# Patient Record
Sex: Male | Born: 1970 | Race: White | Hispanic: No | Marital: Married | Smoking: Never smoker
Health system: Southern US, Community
[De-identification: ages and names within clinical notes are randomized; demographics above are authoritative.]

## PROBLEM LIST (undated history)

## (undated) DIAGNOSIS — F419 Anxiety disorder, unspecified: Secondary | ICD-10-CM

## (undated) DIAGNOSIS — G4730 Sleep apnea, unspecified: Secondary | ICD-10-CM

---

## 2006-03-25 HISTORY — PX: VASECTOMY: SHX75

## 2011-10-31 ENCOUNTER — Ambulatory Visit (INDEPENDENT_AMBULATORY_CARE_PROVIDER_SITE_OTHER): Payer: BC Managed Care – PPO | Admitting: General Surgery

## 2011-11-20 ENCOUNTER — Ambulatory Visit (INDEPENDENT_AMBULATORY_CARE_PROVIDER_SITE_OTHER): Payer: BC Managed Care – PPO | Admitting: General Surgery

## 2011-11-20 ENCOUNTER — Telehealth (INDEPENDENT_AMBULATORY_CARE_PROVIDER_SITE_OTHER): Payer: Self-pay

## 2011-11-20 ENCOUNTER — Encounter (INDEPENDENT_AMBULATORY_CARE_PROVIDER_SITE_OTHER): Payer: Self-pay | Admitting: General Surgery

## 2011-11-20 VITALS — BP 138/94 | HR 77 | Temp 98.2°F | Ht 70.0 in | Wt 162.4 lb

## 2011-11-20 DIAGNOSIS — D1779 Benign lipomatous neoplasm of other sites: Secondary | ICD-10-CM

## 2011-11-20 DIAGNOSIS — D1720 Benign lipomatous neoplasm of skin and subcutaneous tissue of unspecified limb: Secondary | ICD-10-CM

## 2011-11-20 NOTE — Telephone Encounter (Signed)
[facility] (referring office [phone]) called requesting notes to be faxed.

## 2011-11-20 NOTE — Progress Notes (Signed)
Patient ID: Hunter Garza. 39, male   DOB: 1970-07-14, 41 y.o.   MRN: [id number]  Chief Complaint  Patient presents with  . Pre-op Exam    eval fatty nodule inner Rt thigh    HPI Hunter Garza is a 41 y.o. male.  This patient was referred by Dr. Clydene Pugh for evaluation of a right lower extremity mass. He says that he first noticed this mass on his leg about 1 1/2 months ago where it was more prominent than now. He says it was raised up and was visibly noticeable but was not causing a discomfort. He said over this last month it has decreased in size and still remains asymptomatic. He has not noticed any redness or drainage and has no other masses.  HPI  History reviewed. No pertinent past medical history.  Past Surgical History  Procedure Date  . Vasectomy 2008    History reviewed. No pertinent family history.  Social History History  Substance Use Topics  . Smoking status: Never Smoker   . Smokeless tobacco: Not on file  . Alcohol Use: Yes     social    No Known Allergies  Current Outpatient Prescriptions  Medication Sig Dispense Refill  . PARoxetine (PAXIL) 20 MG tablet         Review of Systems Review of Systems All other review of systems negative or noncontributory except as stated in the HPI  Blood pressure 138/94, pulse 77, temperature 98.2 F (36.8 C), temperature source Temporal, height 5\' 10"  (1.778 m), weight 162 lb 6.4 oz (73.664 kg), SpO2 98.00%.  Physical Exam Physical Exam Physical Exam  Vitals reviewed. Constitutional: He is oriented to person, place, and time. He appears well-developed and well-nourished. No distress.  HENT:  Head: Normocephalic and atraumatic.  Mouth/Throat: No oropharyngeal exudate.  Eyes: Conjunctivae and EOM are normal. Pupils are equal, round, and reactive to light. Right eye exhibits no discharge. Left eye exhibits no discharge. No scleral icterus.  Neck: Normal range of motion. No tracheal deviation present.    Cardiovascular: Normal rate, regular rhythm and normal heart sounds.   Pulmonary/Chest: Effort normal and breath sounds normal. No stridor. No respiratory distress. He has no wheezes. He has no rales. He exhibits no tenderness.  Abdominal: Soft. Bowel sounds are normal. He exhibits no distension and no mass. There is no tenderness. There is no rebound and no guarding.  Musculoskeletal: Normal range of motion. He exhibits no edema and no tenderness.  Neurological: He is alert and oriented to person, place, and time.  Skin: Skin is warm and dry. No rash noted. He is not diaphoretic. No erythema. No pallor. 2cm well circumscribed nodule on right upper thigh, no sign of infection, no tenderness Psychiatric: He has a normal mood and affect. His behavior is normal. Judgment and thought content normal.    Data Reviewed Outside records  Assessment    Right thigh lipoma I think that this mass is most likely a small lipoma. It is small and actually decreased in size over the last month. I discussed with him the options for imaging with MRI versus surgical excision and we discussed the pros and cons of each and he would like to continue with observation for now. This is asymptomatic and I think is a low likelihood for malignancy especially given its decrease in size over the last month. I offered excision for treatment and biopsy but again he would like to hold on this for now. If he decides that he would  like to proceed with excision would be happy to schedule this for him as an outpatient procedure. He will call us back if he changes his mind and would like to have this removed   Plan    Continued observation and plan for excision if any change in appearance, increase in size or symptoms.       Lodema Pilot DAVID 11/20/2011, 9:12 AM

## 2014-11-13 ENCOUNTER — Observation Stay (HOSPITAL_COMMUNITY)
Admission: EM | Admit: 2014-11-13 | Discharge: 2014-11-14 | Disposition: A | Discharge status: Home / Self Care | Payer: 59 | Attending: Orthopedic Surgery | Admitting: Orthopedic Surgery

## 2014-11-13 ENCOUNTER — Emergency Department (HOSPITAL_COMMUNITY): Payer: 59

## 2014-11-13 ENCOUNTER — Emergency Department (HOSPITAL_COMMUNITY): Payer: 59 | Admitting: Anesthesiology

## 2014-11-13 ENCOUNTER — Encounter (HOSPITAL_COMMUNITY)
Admission: EM | Disposition: A | Discharge status: Home / Self Care | Payer: Self-pay | Source: Home / Self Care | Attending: Emergency Medicine

## 2014-11-13 ENCOUNTER — Encounter (HOSPITAL_COMMUNITY): Payer: Self-pay

## 2014-11-13 DIAGNOSIS — R52 Pain, unspecified: Secondary | ICD-10-CM | POA: Diagnosis present

## 2014-11-13 DIAGNOSIS — S42032B Displaced fracture of lateral end of left clavicle, initial encounter for open fracture: Secondary | ICD-10-CM | POA: Diagnosis not present

## 2014-11-13 DIAGNOSIS — S80212A Abrasion, left knee, initial encounter: Secondary | ICD-10-CM | POA: Diagnosis not present

## 2014-11-13 DIAGNOSIS — Z8781 Personal history of (healed) traumatic fracture: Secondary | ICD-10-CM

## 2014-11-13 DIAGNOSIS — S42142A Displaced fracture of glenoid cavity of scapula, left shoulder, initial encounter for closed fracture: Secondary | ICD-10-CM | POA: Insufficient documentation

## 2014-11-13 DIAGNOSIS — S42002B Fracture of unspecified part of left clavicle, initial encounter for open fracture: Secondary | ICD-10-CM

## 2014-11-13 DIAGNOSIS — Z79899 Other long term (current) drug therapy: Secondary | ICD-10-CM | POA: Insufficient documentation

## 2014-11-13 DIAGNOSIS — Y9355 Activity, bike riding: Secondary | ICD-10-CM | POA: Insufficient documentation

## 2014-11-13 DIAGNOSIS — Z9889 Other specified postprocedural states: Secondary | ICD-10-CM

## 2014-11-13 DIAGNOSIS — F419 Anxiety disorder, unspecified: Secondary | ICD-10-CM | POA: Insufficient documentation

## 2014-11-13 DIAGNOSIS — S42153A Displaced fracture of neck of scapula, unspecified shoulder, initial encounter for closed fracture: Secondary | ICD-10-CM

## 2014-11-13 DIAGNOSIS — S42143A Displaced fracture of glenoid cavity of scapula, unspecified shoulder, initial encounter for closed fracture: Secondary | ICD-10-CM | POA: Diagnosis present

## 2014-11-13 DIAGNOSIS — G4730 Sleep apnea, unspecified: Secondary | ICD-10-CM | POA: Insufficient documentation

## 2014-11-13 DIAGNOSIS — S42009A Fracture of unspecified part of unspecified clavicle, initial encounter for closed fracture: Secondary | ICD-10-CM | POA: Diagnosis present

## 2014-11-13 DIAGNOSIS — S42022B Displaced fracture of shaft of left clavicle, initial encounter for open fracture: Secondary | ICD-10-CM

## 2014-11-13 DIAGNOSIS — S42023B Displaced fracture of shaft of unspecified clavicle, initial encounter for open fracture: Secondary | ICD-10-CM

## 2014-11-13 HISTORY — PX: ORIF CLAVICULAR FRACTURE: SHX5055

## 2014-11-13 HISTORY — DX: Sleep apnea, unspecified: G47.30

## 2014-11-13 HISTORY — DX: Anxiety disorder, unspecified: F41.9

## 2014-11-13 SURGERY — OPEN REDUCTION INTERNAL FIXATION (ORIF) CLAVICULAR FRACTURE
Anesthesia: General | Site: Shoulder | Laterality: Left

## 2014-11-13 MED ORDER — FENTANYL CITRATE (PF) 250 MCG/5ML IJ SOLN
INTRAMUSCULAR | Status: AC
  Filled 2014-11-13: qty 5

## 2014-11-13 MED ORDER — PROPOFOL 10 MG/ML IV BOLUS
INTRAVENOUS | Status: DC | PRN
  Administered 2014-11-13: 200 mg via INTRAVENOUS

## 2014-11-13 MED ORDER — OXYCODONE-ACETAMINOPHEN 5-325 MG PO TABS: 1.0000 | ORAL_TABLET | Freq: Four times a day (QID) | ORAL | Status: AC | PRN

## 2014-11-13 MED ORDER — DEXTROSE 5 % IV SOLN
1.5000 g | Freq: Once | INTRAVENOUS | Status: AC
  Administered 2014-11-13: 1.5 g via INTRAVENOUS
  Filled 2014-11-13: qty 1.5

## 2014-11-13 MED ORDER — OXYCODONE-ACETAMINOPHEN 5-325 MG PO TABS
1.0000 | ORAL_TABLET | Freq: Once | ORAL | Status: AC
  Administered 2014-11-13: 1 via ORAL

## 2014-11-13 MED ORDER — LIDOCAINE HCL (CARDIAC) 20 MG/ML IV SOLN
INTRAVENOUS | Status: AC
  Filled 2014-11-13: qty 5

## 2014-11-13 MED ORDER — ONDANSETRON HCL 4 MG/2ML IJ SOLN
INTRAMUSCULAR | Status: AC
  Filled 2014-11-13: qty 2

## 2014-11-13 MED ORDER — SODIUM CHLORIDE 0.9 % IR SOLN
Status: DC | PRN
  Administered 2014-11-13: 6000 mL

## 2014-11-13 MED ORDER — LIDOCAINE HCL (CARDIAC) 20 MG/ML IV SOLN
INTRAVENOUS | Status: DC | PRN
  Administered 2014-11-13: 70 mg via INTRAVENOUS

## 2014-11-13 MED ORDER — OXYCODONE-ACETAMINOPHEN 5-325 MG PO TABS
ORAL_TABLET | ORAL | Status: AC
  Filled 2014-11-13: qty 1

## 2014-11-13 MED ORDER — FENTANYL CITRATE (PF) 100 MCG/2ML IJ SOLN
INTRAMUSCULAR | Status: DC | PRN
Start: 2014-11-13 — End: 2014-11-14
  Administered 2014-11-13 (×5): 50 ug via INTRAVENOUS

## 2014-11-13 MED ORDER — HYDROCODONE-ACETAMINOPHEN 5-325 MG PO TABS: 2.0000 | ORAL_TABLET | Freq: Once | ORAL | Status: DC

## 2014-11-13 MED ORDER — ONDANSETRON HCL 4 MG/2ML IJ SOLN
INTRAMUSCULAR | Status: DC | PRN
  Administered 2014-11-13: 4 mg via INTRAVENOUS

## 2014-11-13 MED ORDER — ONDANSETRON HCL 4 MG PO TABS: 4.0000 mg | ORAL_TABLET | Freq: Three times a day (TID) | ORAL | Status: AC | PRN

## 2014-11-13 MED ORDER — SUCCINYLCHOLINE CHLORIDE 20 MG/ML IJ SOLN
INTRAMUSCULAR | Status: DC | PRN
  Administered 2014-11-13: 120 mg via INTRAVENOUS

## 2014-11-13 MED ORDER — PROPOFOL 10 MG/ML IV BOLUS
INTRAVENOUS | Status: AC
  Filled 2014-11-13: qty 20

## 2014-11-13 MED ORDER — OXYCODONE-ACETAMINOPHEN 5-325 MG PO TABS: 1.0000 | ORAL_TABLET | Freq: Once | ORAL | Status: DC

## 2014-11-13 MED ORDER — SUCCINYLCHOLINE CHLORIDE 20 MG/ML IJ SOLN
INTRAMUSCULAR | Status: AC
  Filled 2014-11-13: qty 1

## 2014-11-13 MED ORDER — SODIUM CHLORIDE 0.9 % IV BOLUS (SEPSIS)
1000.0000 mL | Freq: Once | INTRAVENOUS | Status: AC
  Administered 2014-11-13: 1000 mL via INTRAVENOUS

## 2014-11-13 MED ORDER — MIDAZOLAM HCL 5 MG/5ML IJ SOLN
INTRAMUSCULAR | Status: DC | PRN
  Administered 2014-11-13: 2 mg via INTRAVENOUS

## 2014-11-13 MED ORDER — TETANUS-DIPHTH-ACELL PERTUSSIS 5-2.5-18.5 LF-MCG/0.5 IM SUSP
0.5000 mL | Freq: Once | INTRAMUSCULAR | Status: AC
  Administered 2014-11-13: 0.5 mL via INTRAMUSCULAR
  Filled 2014-11-13: qty 0.5

## 2014-11-13 MED ORDER — LACTATED RINGERS IV SOLN
INTRAVENOUS | Status: DC | PRN
  Administered 2014-11-13 (×2): via INTRAVENOUS

## 2014-11-13 MED ORDER — DEXTROSE 5 % IV SOLN: 1.5000 g | INTRAVENOUS | Status: DC | PRN

## 2014-11-13 MED ORDER — SENNA-DOCUSATE SODIUM 8.6-50 MG PO TABS: 2.0000 | ORAL_TABLET | Freq: Every day | ORAL | Status: AC

## 2014-11-13 MED ORDER — DEXTROSE 5 % IV SOLN
INTRAVENOUS | Status: AC
  Filled 2014-11-13: qty 1.5

## 2014-11-13 MED ORDER — DEXTROSE 5 % IV SOLN
1.5000 g | INTRAVENOUS | Status: DC | PRN
  Administered 2014-11-13: 1.5 g via INTRAVENOUS

## 2014-11-13 MED ORDER — MIDAZOLAM HCL 2 MG/2ML IJ SOLN
INTRAMUSCULAR | Status: AC
  Filled 2014-11-13: qty 4

## 2014-11-13 MED ORDER — 0.9 % SODIUM CHLORIDE (POUR BTL) OPTIME
TOPICAL | Status: DC | PRN
  Administered 2014-11-13: 1000 mL

## 2014-11-13 MED ORDER — BACLOFEN 10 MG PO TABS: 10.0000 mg | ORAL_TABLET | Freq: Three times a day (TID) | ORAL | Status: AC

## 2014-11-13 SURGICAL SUPPLY — 78 items
BENZOIN TINCTURE PRP APPL 2/3 (GAUZE/BANDAGES/DRESSINGS) ×3 IMPLANT
BIT DRILL 2.0 LNG QUCK RELEASE (BIT) ×1 IMPLANT
BIT DRILL 2.8X5 QR DISP (BIT) ×3 IMPLANT
BIT DRILL CANN 2.7X625 NONSTRL (BIT) ×3 IMPLANT
CLEANER TIP ELECTROSURG 2X2 (MISCELLANEOUS) IMPLANT
CLOSURE STERI-STRIP 1/2X4 (GAUZE/BANDAGES/DRESSINGS) ×1
CLOSURE WOUND 1/2 X4 (GAUZE/BANDAGES/DRESSINGS) ×1
CLSR STERI-STRIP ANTIMIC 1/2X4 (GAUZE/BANDAGES/DRESSINGS) ×2 IMPLANT
COVER SURGICAL LIGHT HANDLE (MISCELLANEOUS) ×3 IMPLANT
DRAPE C-ARM 42X72 X-RAY (DRAPES) ×3 IMPLANT
DRAPE IMP U-DRAPE 54X76 (DRAPES) ×3 IMPLANT
DRAPE INCISE IOBAN 66X45 STRL (DRAPES) ×3 IMPLANT
DRAPE U-SHAPE 47X51 STRL (DRAPES) ×3 IMPLANT
DRILL 2.0 LNG QUICK RELEASE (BIT) ×3
DURAPREP 26ML APPLICATOR (WOUND CARE) IMPLANT
ELECT REM PT RETURN 9FT ADLT (ELECTROSURGICAL) ×3
ELECTRODE REM PT RTRN 9FT ADLT (ELECTROSURGICAL) ×1 IMPLANT
FLUID NSS /IRRIG 3000 ML XXX (IV SOLUTION) ×6 IMPLANT
GAUZE SPONGE 4X4 12PLY STRL (GAUZE/BANDAGES/DRESSINGS) ×6 IMPLANT
GAUZE XEROFORM 1X8 LF (GAUZE/BANDAGES/DRESSINGS) ×3 IMPLANT
GLOVE BIO SURGEON STRL SZ7 (GLOVE) ×3 IMPLANT
GLOVE BIO SURGEON STRL SZ7.5 (GLOVE) ×6 IMPLANT
GLOVE BIOGEL PI IND STRL 7.0 (GLOVE) ×2 IMPLANT
GLOVE BIOGEL PI IND STRL 7.5 (GLOVE) ×1 IMPLANT
GLOVE BIOGEL PI IND STRL 8 (GLOVE) ×3 IMPLANT
GLOVE BIOGEL PI INDICATOR 7.0 (GLOVE) ×4
GLOVE BIOGEL PI INDICATOR 7.5 (GLOVE) ×2
GLOVE BIOGEL PI INDICATOR 8 (GLOVE) ×6
GLOVE BIOGEL PI ORTHO PRO SZ8 (GLOVE) ×2
GLOVE ORTHO TXT STRL SZ7.5 (GLOVE) ×9 IMPLANT
GLOVE PI ORTHO PRO STRL SZ8 (GLOVE) ×1 IMPLANT
GLOVE SURG ORTHO 8.0 STRL STRW (GLOVE) ×6 IMPLANT
GOWN STRL REUS W/ TWL LRG LVL3 (GOWN DISPOSABLE) ×3 IMPLANT
GOWN STRL REUS W/TWL LRG LVL3 (GOWN DISPOSABLE) ×6
KIT BASIN OR (CUSTOM PROCEDURE TRAY) ×3 IMPLANT
KIT ROOM TURNOVER OR (KITS) ×3 IMPLANT
MANIFOLD NEPTUNE II (INSTRUMENTS) ×3 IMPLANT
NEEDLE HYPO 25GX1X1/2 BEV (NEEDLE) ×3 IMPLANT
NS IRRIG 1000ML POUR BTL (IV SOLUTION) ×3 IMPLANT
PACK SHOULDER (CUSTOM PROCEDURE TRAY) ×3 IMPLANT
PACK UNIVERSAL I (CUSTOM PROCEDURE TRAY) ×3 IMPLANT
PAD ARMBOARD 7.5X6 YLW CONV (MISCELLANEOUS) ×3 IMPLANT
PLATE DISTAL CLAVICAL 13 HOLE (Plate) ×3 IMPLANT
SCREW BN FT 16X2.3XLCK HEX CRT (Screw) ×2 IMPLANT
SCREW CANN L THRD/20 4.0 (Screw) ×3 IMPLANT
SCREW CANN L THRD/30 4.0 (Screw) ×3 IMPLANT
SCREW CORT FT 18X2.3XLCK HEX (Screw) ×1 IMPLANT
SCREW CORTICAL 2.3X14 (Screw) ×3 IMPLANT
SCREW CORTICAL LOCKING 2.3X14M (Screw) ×3 IMPLANT
SCREW CORTICAL LOCKING 2.3X16M (Screw) ×4 IMPLANT
SCREW CORTICAL LOCKING 2.3X18M (Screw) ×2 IMPLANT
SCREW HEXALOBE NON-LOCK 3.5X14 (Screw) ×6 IMPLANT
SCREW HEXALOBE NON-LOCK 3.5X16 (Screw) ×3 IMPLANT
SCRUB BETADINE 4OZ XXX (MISCELLANEOUS) ×3 IMPLANT
SLING ARM IMMOBILIZER MED (SOFTGOODS) ×3 IMPLANT
SOLUTION BETADINE 4OZ (MISCELLANEOUS) ×3 IMPLANT
SPONGE GAUZE 4X4 12PLY STER LF (GAUZE/BANDAGES/DRESSINGS) ×3 IMPLANT
SPONGE LAP 18X18 X RAY DECT (DISPOSABLE) ×6 IMPLANT
SPONGE LAP 4X18 X RAY DECT (DISPOSABLE) ×3 IMPLANT
SPRAY BETADINE AEROSOL 3OZ (MISCELLANEOUS) ×3 IMPLANT
STAPLER VISISTAT 35W (STAPLE) IMPLANT
STRIP CLOSURE SKIN 1/2X4 (GAUZE/BANDAGES/DRESSINGS) ×2 IMPLANT
SUCTION FRAZIER TIP 10 FR DISP (SUCTIONS) ×3 IMPLANT
SUT ETHILON 2 0 FS 18 (SUTURE) ×6 IMPLANT
SUT FIBERWIRE #2 38 T-5 BLUE (SUTURE)
SUT MNCRL AB 4-0 PS2 18 (SUTURE) IMPLANT
SUT VIC AB 0 CTB1 27 (SUTURE) ×3 IMPLANT
SUT VIC AB 2-0 CT1 36 (SUTURE) IMPLANT
SUT VIC AB 3-0 FS2 27 (SUTURE) IMPLANT
SUT VIC AB 3-0 SH 8-18 (SUTURE) ×3 IMPLANT
SUTURE FIBERWR #2 38 T-5 BLUE (SUTURE) IMPLANT
SYR BULB IRRIGATION 50ML (SYRINGE) ×3 IMPLANT
SYR CONTROL 10ML LL (SYRINGE) ×3 IMPLANT
TAPE CLOTH SURG 6X10 WHT LF (GAUZE/BANDAGES/DRESSINGS) ×3 IMPLANT
TOWEL OR 17X24 6PK STRL BLUE (TOWEL DISPOSABLE) ×6 IMPLANT
TOWEL OR 17X26 10 PK STRL BLUE (TOWEL DISPOSABLE) ×3 IMPLANT
TUBING CYSTO DISP (UROLOGICAL SUPPLIES) ×3 IMPLANT
WATER STERILE IRR 1000ML POUR (IV SOLUTION) IMPLANT

## 2014-11-13 NOTE — Discharge Instructions (Signed)

## 2014-11-13 NOTE — ED Notes (Signed)
Report to OR - Pt to CT scan to LT shoulder .
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
## 2014-11-13 NOTE — Anesthesia Preprocedure Evaluation (Addendum)
Anesthesia Evaluation  Patient identified by MRN, date of birth, ID band Patient awake    Reviewed: Allergy & Precautions, NPO status , Patient's Chart, lab work & pertinent test results  History of Anesthesia Complications Negative for: history of anesthetic complications  Airway Mallampati: I  TM Distance: >3 FB Neck ROM: Full    Dental  (+) Teeth Intact, Dental Advisory Given   Pulmonary neg pulmonary ROS,  breath sounds clear to auscultation        Cardiovascular negative cardio ROS  Rhythm:Regular Rate:Normal     Neuro/Psych negative neurological ROS     GI/Hepatic Neg liver ROS,   Endo/Other  negative endocrine ROS  Renal/GU      Musculoskeletal negative musculoskeletal ROS (+)   Abdominal   Peds  Hematology negative hematology ROS (+)   Anesthesia Other Findings   Reproductive/Obstetrics negative OB ROS                            Anesthesia Physical Anesthesia Plan  ASA: I and emergent  Anesthesia Plan: General   Post-op Pain Management:    Induction: Intravenous  Airway Management Planned: Oral ETT  Additional Equipment:   Intra-op Plan:   Post-operative Plan: Extubation in OR  Informed Consent: I have reviewed the patients History and Physical, chart, labs and discussed the procedure including the risks, benefits and alternatives for the proposed anesthesia with the patient or authorized representative who has indicated his/her understanding and acceptance.   Dental advisory given  Plan Discussed with: CRNA and Surgeon  Anesthesia Plan Comments:         Anesthesia Quick Evaluation

## 2014-11-13 NOTE — ED Notes (Signed)
OR staff instructed IV antibx sent with PT to OR.

## 2014-11-13 NOTE — Anesthesia Procedure Notes (Signed)
Procedure Name: Intubation Date/Time: 11/13/2014 8:59 PM Performed by: Manuela Schwartz B Pre-anesthesia Checklist: Patient identified, Emergency Drugs available, Suction available, Patient being monitored and Timeout performed Patient Re-evaluated:Patient Re-evaluated prior to inductionOxygen Delivery Method: Circle system utilized Preoxygenation: Pre-oxygenation with 100% oxygen Intubation Type: IV induction and Rapid sequence Laryngoscope Size: Mac and 3 Grade View: Grade I Tube type: Oral Tube size: 7.5 mm Number of attempts: 1 Airway Equipment and Method: Stylet Placement Confirmation: ETT inserted through vocal cords under direct vision,  positive ETCO2 and breath sounds checked- equal and bilateral Secured at: 23 cm Tube secured with: Tape Dental Injury: Teeth and Oropharynx as per pre-operative assessment
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
## 2014-11-14 ENCOUNTER — Observation Stay (HOSPITAL_COMMUNITY): Payer: 59

## 2014-11-14 ENCOUNTER — Emergency Department (HOSPITAL_COMMUNITY): Payer: 59

## 2014-11-14 ENCOUNTER — Encounter (HOSPITAL_COMMUNITY): Payer: 59 | Admitting: Anesthesiology

## 2014-11-14 ENCOUNTER — Encounter (HOSPITAL_COMMUNITY): Payer: Self-pay | Admitting: Anesthesiology

## 2014-11-14 ENCOUNTER — Encounter (HOSPITAL_COMMUNITY)
Admission: EM | Disposition: A | Discharge status: Home / Self Care | Payer: Self-pay | Source: Home / Self Care | Attending: Emergency Medicine

## 2014-11-14 DIAGNOSIS — F419 Anxiety disorder, unspecified: Secondary | ICD-10-CM | POA: Insufficient documentation

## 2014-11-14 DIAGNOSIS — L709 Acne, unspecified: Secondary | ICD-10-CM | POA: Insufficient documentation

## 2014-11-14 DIAGNOSIS — S42009A Fracture of unspecified part of unspecified clavicle, initial encounter for closed fracture: Secondary | ICD-10-CM | POA: Diagnosis present

## 2014-11-14 DIAGNOSIS — G4733 Obstructive sleep apnea (adult) (pediatric): Secondary | ICD-10-CM | POA: Insufficient documentation

## 2014-11-14 LAB — BASIC METABOLIC PANEL
ANION GAP: 8 (ref 5–15)
BUN: 13 mg/dL (ref 6–20)
CHLORIDE: 100 mmol/L — AB (ref 101–111)
CO2: 27 mmol/L (ref 22–32)
Calcium: 8.4 mg/dL — ABNORMAL LOW (ref 8.9–10.3)
Creatinine, Ser: 1.07 mg/dL (ref 0.61–1.24)
GFR calc Af Amer: >60 mL/min (ref >60)
GFR calc non Af Amer: >60 mL/min (ref >60)
Glucose, Bld: 134 mg/dL — ABNORMAL HIGH (ref 65–99)
POTASSIUM: 4.2 mmol/L (ref 3.5–5.1)
Sodium: 135 mmol/L (ref 135–145)

## 2014-11-14 LAB — CBC
HCT: 35.9 % — ABNORMAL LOW (ref 39.0–52.0)
HEMOGLOBIN: 12.5 g/dL — AB (ref 13.0–17.0)
MCH: 30.4 pg (ref 26.0–34.0)
MCHC: 34.8 g/dL (ref 30.0–36.0)
MCV: 87.3 fL (ref 78.0–100.0)
Platelets: 156 10*3/uL (ref 150–400)
RBC: 4.11 MIL/uL — AB (ref 4.22–5.81)
RDW: 12.5 % (ref 11.5–15.5)
WBC: 9.4 10*3/uL (ref 4.0–10.5)

## 2014-11-14 SURGERY — OPEN REDUCTION INTERNAL FIXATION (ORIF) SHOULDER FRACTURE
Anesthesia: General | Laterality: Left

## 2014-11-14 MED ORDER — POTASSIUM CHLORIDE IN NACL 20-0.45 MEQ/L-% IV SOLN
INTRAVENOUS | Status: DC
  Administered 2014-11-14: 03:00:00 via INTRAVENOUS
  Filled 2014-11-14 (×3): qty 1000

## 2014-11-14 MED ORDER — ONDANSETRON HCL 4 MG/2ML IJ SOLN
4.0000 mg | Freq: Four times a day (QID) | INTRAMUSCULAR | Status: DC | PRN
  Administered 2014-11-14: 4 mg via INTRAVENOUS
  Filled 2014-11-14: qty 2

## 2014-11-14 MED ORDER — PROMETHAZINE HCL 25 MG/ML IJ SOLN: 6.2500 mg | INTRAMUSCULAR | Status: DC | PRN

## 2014-11-14 MED ORDER — OXYCODONE HCL 5 MG PO TABS
5.0000 mg | ORAL_TABLET | ORAL | Status: DC | PRN
  Administered 2014-11-14 (×2): 10 mg via ORAL
  Filled 2014-11-14 (×2): qty 2

## 2014-11-14 MED ORDER — DOCUSATE SODIUM 100 MG PO CAPS
100.0000 mg | ORAL_CAPSULE | Freq: Two times a day (BID) | ORAL | Status: DC
  Administered 2014-11-14: 100 mg via ORAL
  Filled 2014-11-14: qty 1

## 2014-11-14 MED ORDER — MAGNESIUM CITRATE PO SOLN: 1.0000 | Freq: Once | ORAL | Status: DC | PRN

## 2014-11-14 MED ORDER — METOCLOPRAMIDE HCL 5 MG/ML IJ SOLN: 5.0000 mg | Freq: Three times a day (TID) | INTRAMUSCULAR | Status: DC | PRN

## 2014-11-14 MED ORDER — ACETAMINOPHEN 325 MG PO TABS: 650.0000 mg | ORAL_TABLET | Freq: Four times a day (QID) | ORAL | Status: DC | PRN

## 2014-11-14 MED ORDER — DIPHENHYDRAMINE HCL 12.5 MG/5ML PO ELIX: 12.5000 mg | ORAL_SOLUTION | ORAL | Status: DC | PRN

## 2014-11-14 MED ORDER — METOCLOPRAMIDE HCL 5 MG PO TABS: 5.0000 mg | ORAL_TABLET | Freq: Three times a day (TID) | ORAL | Status: DC | PRN

## 2014-11-14 MED ORDER — ONDANSETRON HCL 4 MG PO TABS: 4.0000 mg | ORAL_TABLET | Freq: Four times a day (QID) | ORAL | Status: DC | PRN

## 2014-11-14 MED ORDER — BISACODYL 10 MG RE SUPP: 10.0000 mg | Freq: Every day | RECTAL | Status: DC | PRN

## 2014-11-14 MED ORDER — CEFAZOLIN SODIUM 1-5 GM-% IV SOLN
1.0000 g | Freq: Four times a day (QID) | INTRAVENOUS | Status: DC
  Administered 2014-11-14 (×2): 1 g via INTRAVENOUS
  Filled 2014-11-14 (×6): qty 50

## 2014-11-14 MED ORDER — HYDROMORPHONE HCL 1 MG/ML IJ SOLN: 1.0000 mg | INTRAMUSCULAR | Status: DC | PRN

## 2014-11-14 MED ORDER — PAROXETINE HCL 20 MG PO TABS
20.0000 mg | ORAL_TABLET | Freq: Every day | ORAL | Status: DC
  Administered 2014-11-14: 20 mg via ORAL
  Filled 2014-11-14: qty 1

## 2014-11-14 MED ORDER — MENTHOL 3 MG MT LOZG: 1.0000 | LOZENGE | OROMUCOSAL | Status: DC | PRN

## 2014-11-14 MED ORDER — METHOCARBAMOL 500 MG PO TABS: 500.0000 mg | ORAL_TABLET | Freq: Four times a day (QID) | ORAL | Status: DC | PRN

## 2014-11-14 MED ORDER — CEPHALEXIN 500 MG PO CAPS: 500.0000 mg | ORAL_CAPSULE | Freq: Four times a day (QID) | ORAL | Status: AC

## 2014-11-14 MED ORDER — METHOCARBAMOL 1000 MG/10ML IJ SOLN: 500.0000 mg | Freq: Four times a day (QID) | INTRAVENOUS | Status: DC | PRN

## 2014-11-14 MED ORDER — PHENOL 1.4 % MT LIQD: 1.0000 | OROMUCOSAL | Status: DC | PRN

## 2014-11-14 MED ORDER — ACETAMINOPHEN 650 MG RE SUPP: 650.0000 mg | Freq: Four times a day (QID) | RECTAL | Status: DC | PRN

## 2014-11-14 MED ORDER — HYDROMORPHONE HCL 1 MG/ML IJ SOLN: 0.2500 mg | INTRAMUSCULAR | Status: DC | PRN

## 2014-11-14 MED ORDER — POLYETHYLENE GLYCOL 3350 17 G PO PACK: 17.0000 g | PACK | Freq: Every day | ORAL | Status: DC | PRN

## 2014-11-14 MED ORDER — SENNA 8.6 MG PO TABS
1.0000 | ORAL_TABLET | Freq: Two times a day (BID) | ORAL | Status: DC
  Administered 2014-11-14: 8.6 mg via ORAL
  Filled 2014-11-14: qty 1

## 2014-11-14 MED ORDER — ALUM & MAG HYDROXIDE-SIMETH 200-200-20 MG/5ML PO SUSP: 30.0000 mL | ORAL | Status: DC | PRN

## 2014-11-14 NOTE — Evaluation (Signed)
Physical Therapy Evaluation Patient Details Name: Hunter Garza. Feagans MRN: [id number] DOB: Jun 30, 1970 Today's Date: 11/14/2014   History of Present Illness  IRRIGATION AND DEBRIDEMENT WITH OPEN REDUCTION INTERNAL FIXATION (ORIF) CLAVICULAR FRACTURE AND LEFT GLENOID FRACTURE on 11/13/2014 - 11/14/2014  Clinical Impression  Patient is doing excellent with mobility at this time. No need for further PT sessions for mobility. Patient denies any questions or concerns. Will D/C from PT services.     Follow Up Recommendations No PT follow up    Equipment Recommendations  None recommended by PT    Recommendations for Other Services       Precautions / Restrictions Precautions Precautions: None Precaution Comments: according to pt, PA stated he can take sling off for elbow ROM ONLY Required Braces or Orthoses: Sling Restrictions Weight Bearing Restrictions: Yes LUE Weight Bearing: Non weight bearing      Mobility  Bed Mobility Overal bed mobility: Independent             General bed mobility comments: supine to sit, no assistance needed.   Transfers Overall transfer level: Independent Equipment used: None             General transfer comment: independent with sit/stand  Ambulation/Gait Ambulation/Gait assistance: Independent Ambulation Distance (Feet): 400 Feet Assistive device: None Gait Pattern/deviations: WFL(Within Functional Limits)   Gait velocity interpretation: at or above normal speed for age/gender General Gait Details: safe and stable pattern, no loss of balance or instability  Stairs Stairs: Yes Stairs assistance: Supervision Stair Management: No rails Number of Stairs: 2    Wheelchair Mobility    Modified Rankin (Stroke Patients Only)       Balance Overall balance assessment: Independent                                           Pertinent Vitals/Pain Pain Assessment: 0-10 Pain Score: 1  Pain Location: LUE  shoulder Pain Descriptors / Indicators: Aching Pain Intervention(s): Monitored during session    Home Living Family/patient expects to be discharged to:: Private residence Living Arrangements: Alone Available Help at Discharge: Other (Comment) (girlfriend) Type of Home: House Home Access: Stairs to enter Entrance Stairs-Rails: None Entrance Stairs-Number of Steps: 2 Home Layout: One level Home Equipment: Hand held shower head      Prior Function Level of Independence: Independent               Hand Dominance   Dominant Hand: Right    Extremity/Trunk Assessment   Upper Extremity Assessment: LUE deficits/detail           Lower Extremity Assessment: Overall WFL for tasks assessed         Communication   Communication: No difficulties  Cognition Arousal/Alertness: Awake/alert Behavior During Therapy: WFL for tasks assessed/performed Overall Cognitive Status: Within Functional Limits for tasks assessed                      General Comments      Exercises        Assessment/Plan    PT Assessment Patent does not need any further PT services  PT Diagnosis Difficulty walking   PT Problem List    PT Treatment Interventions     PT Goals (Current goals can be found in the Care Plan section) Acute Rehab PT Goals Patient Stated Goal: go home PT Goal Formulation: With patient Time  For Goal Achievement: 11/28/14 Potential to Achieve Goals: Good    Frequency     Barriers to discharge        Co-evaluation               End of Session Equipment Utilized During Treatment: Gait belt Activity Tolerance: Patient tolerated treatment well Patient left: in chair;with call bell/phone within reach Nurse Communication: Mobility status    Functional Assessment Tool Used: clinical judgment Functional Limitation: Mobility: Walking and moving around Mobility: Walking and Moving Around Current Status [phone]): 0 percent impaired, limited or  restricted Mobility: Walking and Moving Around Goal Status [phone]): 0 percent impaired, limited or restricted Mobility: Walking and Moving Around Discharge Status [phone]): 0 percent impaired, limited or restricted    Time: 5732-2025 PT Time Calculation (min) (ACUTE ONLY): 23 min   Charges:   PT Evaluation $Initial PT Evaluation Tier I: 1 Procedure PT Treatments $Gait Training: 8-22 mins   PT G Codes:   PT G-Codes **NOT FOR INPATIENT CLASS** Functional Assessment Tool Used: clinical judgment Functional Limitation: Mobility: Walking and moving around Mobility: Walking and Moving Around Current Status (K2706): 0 percent impaired, limited or restricted Mobility: Walking and Moving Around Goal Status (C3762): 0 percent impaired, limited or restricted Mobility: Walking and Moving Around Discharge Status (G3151): 0 percent impaired, limited or restricted    Cassell Clement, PT, CSCS Pager [phone] Office [phone]  11/14/2014, 10:52 AM

## 2014-11-14 NOTE — Anesthesia Postprocedure Evaluation (Signed)
  Anesthesia Post-op Note  Patient: Hunter Garza. Helton  Procedure(s) Performed: Procedure(s): IRRIGATION AND DEBRIDEMENT WITH OPEN REDUCTION INTERNAL FIXATION (ORIF) CLAVICULAR FRACTURE AND LEFT GLENOID FRACTURE (Left)  Patient Location: PACU  Anesthesia Type:General  Level of Consciousness: awake and alert   Airway and Oxygen Therapy: Patient Spontanous Breathing  Post-op Pain: mild  Post-op Assessment: Post-op Vital signs reviewed              Post-op Vital Signs: stable  Last Vitals:  Filed Vitals:   11/14/14 0044  BP: 139/84  Pulse: 78  Temp: 37.1 C  Resp:     Complications: No apparent anesthesia complications
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
## 2014-11-14 NOTE — Progress Notes (Signed)
Finally obtained the coronal images on the CT.    Superior glenoid appears reduced, likely secondary to correction of the clavicular position, which has reduced the supragleoid tubercle through the suspensory ligaments of the CC ligaments attached to the coracoid with is attached to the supraglenoid tubercle.   Therefore, given the near anatomic reduction of the glenoid, and the indirect suspensory fixation through the clavicular plate, I have recommended non-op management at this point, as the risks of revision glenoid fixation outweigh the benefits for now, as long as the glenoid remains in anatomic alignment.  Will dc home, and I have spoken with the patient and he is in agreement with the plan.  Johnny Bridge, MD

## 2014-11-14 NOTE — Progress Notes (Signed)
CT demonstrates inadequate fixation for glenoid, although there is improved position.  Plan for revision fixation today, discussed with patient.   Johnny Bridge, MD

## 2014-11-14 NOTE — Evaluation (Signed)
Occupational Therapy Evaluation Patient Details Name: Hunter Garza. Hunter Garza MRN: [id number] DOB: February 14, 1971 Today's Date: 11/14/2014    History of Present Illness IRRIGATION AND DEBRIDEMENT WITH OPEN REDUCTION INTERNAL FIXATION (ORIF) CLAVICULAR FRACTURE AND LEFT GLENOID FRACTURE on 11/13/2014 - 11/14/2014. Plan is for additional surgery evening of 8/22 per pt.    Clinical Impression   Patient presenting with decreased ADLs secondary to immobilization of LUE shoulder. Patient independent PTA. Patient currently requires up to min assist with ADLs and is independent with functional mobility & transfers. Patient will benefit from acute OT to increase overall independence in the areas of ADLs, functional mobility, and overall safety in order to safely discharge home with intermittent assistance from girlfriend.   When therapist in room, Dr. Mardelle Garza called to talk with patient. Per patient report, plan is for him to have another surgery this afternoon/evening. Administered d/c shoulder protocol handout, but did not doff sling for exercises even though PA, Hunter Garza gave the okay for this(prior to plan for surgery tomorrow). Did not doff sling secondary to plan for surgery today, will wait for new ROM/shoulder mobility orders to work with patient tomorrow. As of now, educated patient on NWB, no A/PROM > left shoulder, sling on at all times except during ADLs/exercise.     Follow Up Recommendations  No OT follow up;Supervision - Intermittent    Equipment Recommendations  3 in 1 bedside comode (pt may refuse)    Recommendations for Other Services  None at this time   Precautions / Restrictions Precautions Precautions: None Precaution Comments: according to pt, PA stated he can take sling off for elbow ROM ONLY Required Braces or Orthoses: Sling Restrictions Weight Bearing Restrictions: Yes LUE Weight Bearing: Non weight bearing    Mobility Bed Mobility Overal bed mobility: Independent General bed  mobility comments: supine to sit, no assistance needed.   Transfers Overall transfer level: Independent Equipment used: None General transfer comment: independent with sit/stand and ambulation/mobility     Balance Overall balance assessment: Independent    ADL Overall ADL's : Needs assistance/impaired General ADL Comments: Pt requires assistance with UB/LB ADLs secondary to immobilization of LUE shoulder. Pt independent with mobility and transfers. Will need to pick up for additional education and practice for use of LUE.     Pertinent Vitals/Pain Pain Assessment: 0-10 Pain Score: 1  Pain Location: LUE shoulder Pain Descriptors / Indicators: Aching Pain Intervention(s): Monitored during session     Hand Dominance Right   Extremity/Trunk Assessment Upper Extremity Assessment Upper Extremity Assessment: LUE deficits/detail LUE Deficits / Details: NO A/PROM > LUE shoulder. Elbow, wrist, hand ROM OK LUE: Unable to fully assess due to immobilization   Lower Extremity Assessment Lower Extremity Assessment: Overall WFL for tasks assessed   Cervical / Trunk Assessment Cervical / Trunk Assessment: Normal   Communication Communication Communication: No difficulties   Cognition Arousal/Alertness: Awake/alert Behavior During Therapy: WFL for tasks assessed/performed Overall Cognitive Status: Within Functional Limits for tasks assessed         Shoulder Instructions Shoulder Instructions Donning/doffing shirt without moving shoulder:  (did not occur secondary to pt's report of surgery today, nothing in chart yet) Method for sponge bathing under operated UE:  (did not occur secondary to pt's report of surgery today, nothing in chart yet) Donning/doffing sling/immobilizer:  (did not occur secondary to pt's report of surgery today, nothing in chart yet) Correct positioning of sling/immobilizer: Patient able to independently direct caregiver;Supervision/safety Pendulum exercises  (written home exercise program):  (n/a) ROM for elbow,  wrist and digits of operated UE:  (did not occur secondary to pt's report of surgery today, nothing in chart yet) Sling wearing schedule (on at all times/off for ADL's): Independent;Patient able to independently direct caregiver Proper positioning of operated UE when showering: Supervision/safety;Patient able to independently direct caregiver Dressing change:  (n/a) Positioning of UE while sleeping: Supervision/safety;Patient able to independently direct caregiver    Home Living Family/patient expects to be discharged to:: Private residence Living Arrangements: Alone Available Help at Discharge: Other (Comment) (girlfriend) Type of Home: House Home Access: Stairs to enter [employer] of Steps: 2 Entrance Stairs-Rails: None Home Layout: One level     Bathroom Shower/Tub: Walk-in shower;Door   [employer] Toilet: Standard     Home Equipment: Hand held shower head   Prior Functioning/Environment Level of Independence: Independent     OT Diagnosis: Generalized weakness;Acute pain   OT Problem List: Decreased strength;Decreased range of motion;Decreased activity tolerance;Pain;Decreased safety awareness;Decreased knowledge of use of DME or AE;Decreased knowledge of precautions   OT Treatment/Interventions: Self-care/ADL training;Therapeutic exercise;Energy conservation;DME and/or AE instruction;Therapeutic activities;Patient/family education    OT Goals(Current goals can be found in the care plan section) Acute Rehab OT Goals Patient Stated Goal: go home after surgery  OT Goal Formulation: With patient/family Time For Goal Achievement: 11/28/14 Potential to Achieve Goals: Good ADL Goals Pt Will Perform Upper Body Bathing: with supervision;standing Pt Will Perform Upper Body Dressing: with supervision;sitting;standing Pt Will Transfer to Toilet: Independently;ambulating Pt Will Perform Tub/Shower Transfer: Shower  transfer;Independently Pt/caregiver will Perform Home Exercise Program: Increased ROM;With written HEP provided;Independently (elbow, wrist, hand ONLY) Additional ADL Goal #1: Pt will independently don/doff sling  OT Frequency: Min 2X/week   Barriers to D/C: None known at this time   End of Session Equipment Utilized During Treatment: Other (comment) (sling)  Activity Tolerance: Patient tolerated treatment well Patient left: in chair;with call bell/phone within reach;with family/visitor present   Time: 2355-7322 OT Time Calculation (min): 40 min Charges:  OT General Charges $OT Visit: 1 Procedure OT Evaluation $Initial OT Evaluation Tier I: 1 Procedure OT Treatments $Self Care/Home Management : 8-22 mins $Therapeutic Exercise: 8-22 mins G-Codes: OT G-codes **NOT FOR INPATIENT CLASS** Functional Limitation: Self care Self Care Current Status (G2542): At least 20 percent but less than 40 percent impaired, limited or restricted Self Care Goal Status (H0623): At least 1 percent but less than 20 percent impaired, limited or restricted  Blessyn [place] , MS, OTR/L, CLT Pager: [phone]  11/14/2014, 11:46 AM

## 2014-11-14 NOTE — Progress Notes (Addendum)
Patient ID: Luie Laneve. 11, male   DOB: 08-11-1970, 44 y.o.   MRN: [id number]     Subjective:  Patient reports pain as mild to moderate.  Patient in bed and in no acute distress denies any CP or SOB  Objective:   VITALS:   Filed Vitals:   11/14/14 0026 11/14/14 0044 11/14/14 0226 11/14/14 0428  BP: 144/84 139/84  138/85  Pulse: 84 78 75 82  Temp: 98.6 F (37 C) 98.8 F (37.1 C)  99.5 F (37.5 C)  TempSrc:  Oral  Oral  Resp: 17  16 16   Height:  5\' 10"  (1.778 m)    Weight:  74.8 kg (164 lb 14.5 oz)    SpO2: 100% 97% 98% 96%    ABD soft Sensation intact distally Dorsiflexion/Plantar flexion intact Incision: moderate drainage Good wrist and hand function Infraspinatus intact with active ER of shoulder.   Lab Results  Component Value Date   WBC 9.4 11/14/2014   HGB 12.5* 11/14/2014   HCT 35.9* 11/14/2014   MCV 87.3 11/14/2014   PLT 156 11/14/2014   BMET    Component Value Date/Time   NA 135 11/14/2014 0427   K 4.2 11/14/2014 0427   CL 100* 11/14/2014 0427   CO2 27 11/14/2014 0427   GLUCOSE 134* 11/14/2014 0427   BUN 13 11/14/2014 0427   CREATININE 1.07 11/14/2014 0427   CALCIUM 8.4* 11/14/2014 0427   GFRNONAA >60 11/14/2014 0427   GFRAA >60 11/14/2014 0427     Assessment/Plan: 1 Day Post-Op   Active Problems:   Glenoid fracture of shoulder   Open fracture clavicle, shaft   Clavicle fracture   Advance diet Up with therapy Plan for DC home after CT scan if the scan is clear Sling at all times  Dry dressing PRN NWB left upper ext   Remonia Richter 11/14/2014, 7:29 AM  Seen and agree.   Marchia Bond, MD Cell [phone]

## 2014-11-14 NOTE — Anesthesia Preprocedure Evaluation (Deleted)
Anesthesia Evaluation  Patient identified by MRN, date of birth, ID band Patient awake    History of Anesthesia Complications Negative for: history of anesthetic complications  Airway        Dental   Pulmonary neg pulmonary ROS, sleep apnea and Continuous Positive Airway Pressure Ventilation ,          Cardiovascular     Neuro/Psych Anxiety negative neurological ROS  negative psych ROS   GI/Hepatic negative GI ROS, Neg liver ROS,   Endo/Other  negative endocrine ROS  Renal/GU negative Renal ROS  negative genitourinary   Musculoskeletal negative musculoskeletal ROS (+)   Abdominal   Peds negative pediatric ROS (+)  Hematology negative hematology ROS (+)   Anesthesia Other Findings   Reproductive/Obstetrics negative OB ROS                             Anesthesia Physical Anesthesia Plan  ASA: II  Anesthesia Plan: General   Post-op Pain Management:    Induction: Intravenous  Airway Management Planned:   Additional Equipment:   Intra-op Plan:   Post-operative Plan:   Informed Consent: I have reviewed the patients History and Physical, chart, labs and discussed the procedure including the risks, benefits and alternatives for the proposed anesthesia with the patient or authorized representative who has indicated his/her understanding and acceptance.   Dental advisory given  Plan Discussed with:   Anesthesia Plan Comments:         Anesthesia Quick Evaluation

## 2014-11-14 NOTE — Progress Notes (Signed)
Pt arrived to unit alert and oriented. Oriented to unit and room. Questions answered. Bed alarm activated. Call bell at side. Will continue to monitor. Bobbye Charleston, RN

## 2014-11-14 NOTE — Transfer of Care (Signed)
Immediate Anesthesia Transfer of Care Note  Patient: Hunter Garza. Pais  Procedure(s) Performed: Procedure(s): IRRIGATION AND DEBRIDEMENT WITH OPEN REDUCTION INTERNAL FIXATION (ORIF) CLAVICULAR FRACTURE AND LEFT GLENOID FRACTURE (Left)  Patient Location: PACU  Anesthesia Type:General  Level of Consciousness: awake, alert  and patient cooperative  Airway & Oxygen Therapy: Patient Spontanous Breathing and Patient connected to nasal cannula oxygen  Post-op Assessment: Report given to RN and Post -op Vital signs reviewed and stable  Post vital signs: Reviewed and stable  Last Vitals:  Filed Vitals:   11/13/14 1910  BP: 149/96  Pulse: 80  Temp: 37.3 C  Resp: 18    Complications: No apparent anesthesia complications

## 2014-11-14 NOTE — Progress Notes (Signed)
Orthopedic Tech Progress Note Patient Details:  Hunter Garza. Glas 07/03/1970 [id number]  Ortho Devices Type of Ortho Device: Sling immobilizer Ortho Device/Splint Interventions: Application   Cammer, Theodoro Parma 11/14/2014, 12:38 PM

## 2014-11-15 ENCOUNTER — Encounter (HOSPITAL_COMMUNITY): Payer: Self-pay | Admitting: Orthopedic Surgery

## 2016-11-02 IMAGING — RF DG C-ARM 61-120 MIN
1 series · 8 of 8 positions shown · non-contrast
Comparison: 11/13/2014

CLINICAL DATA: Intraoperative fixation of fractures of the left
shoulder.

EXAM:
DG C-ARM 61-120 MIN; LEFT SHOULDER - 2+ VIEW

[Series 1: run · 8 of 8 slices shown]
[im 1/8]
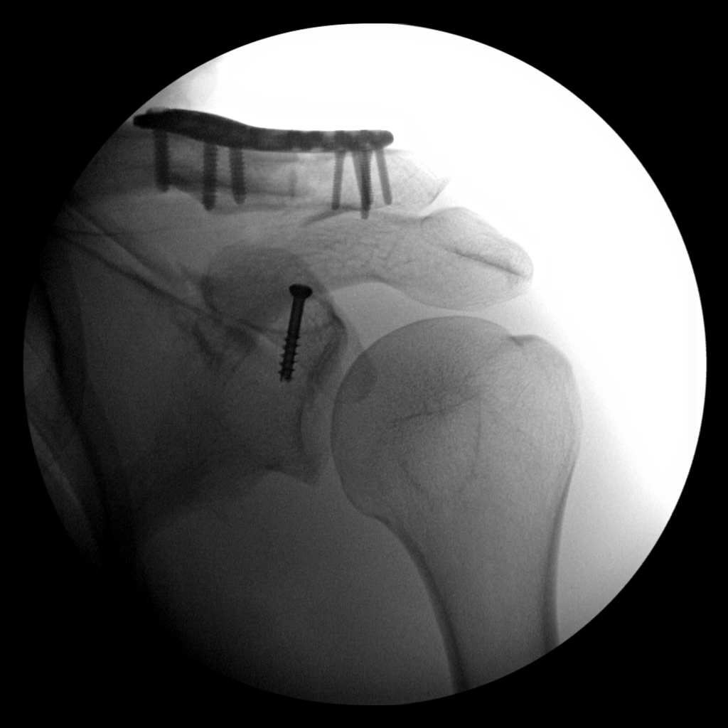
[im 2/8]
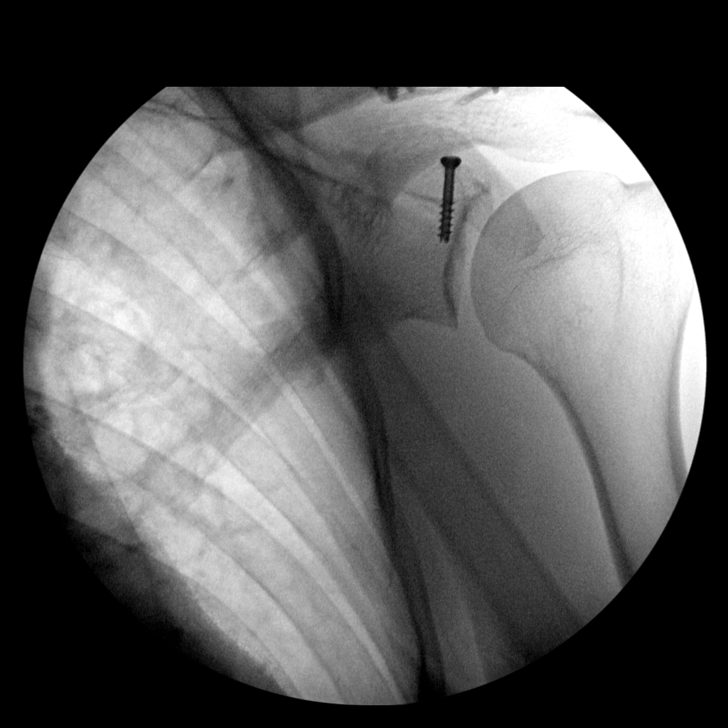
[im 3/8]
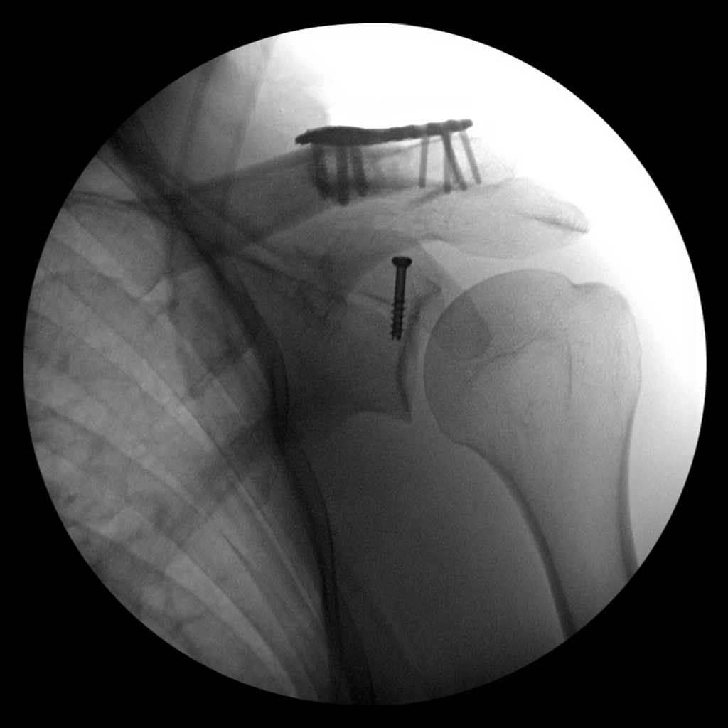
[im 4/8]
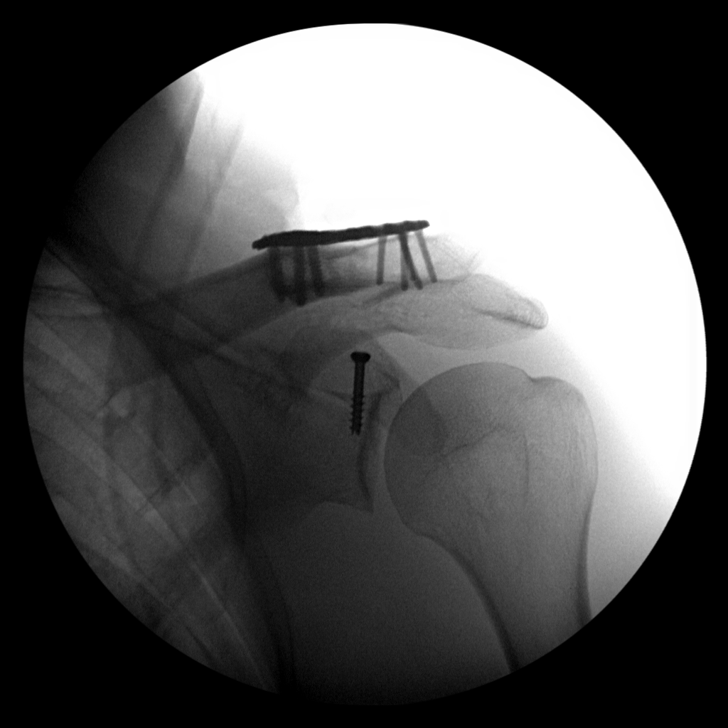
[im 5/8]
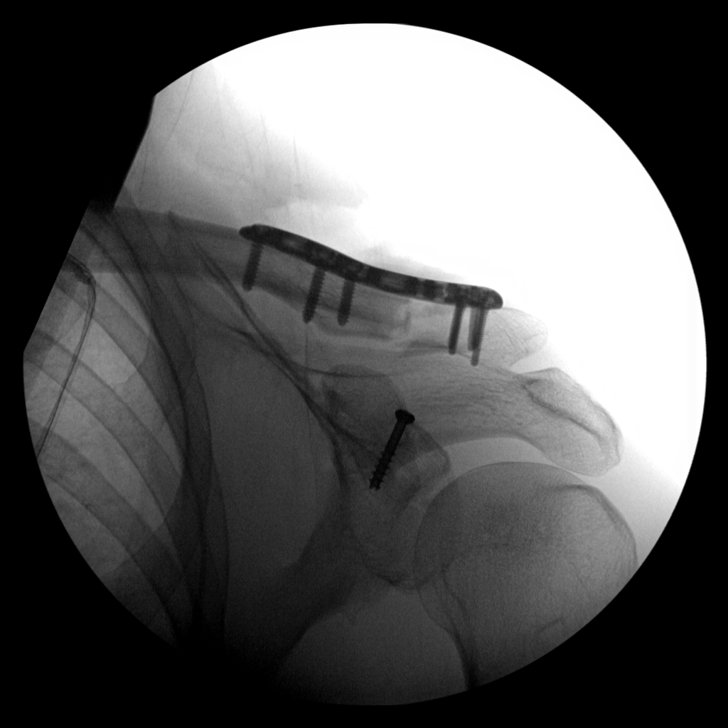
[im 6/8]
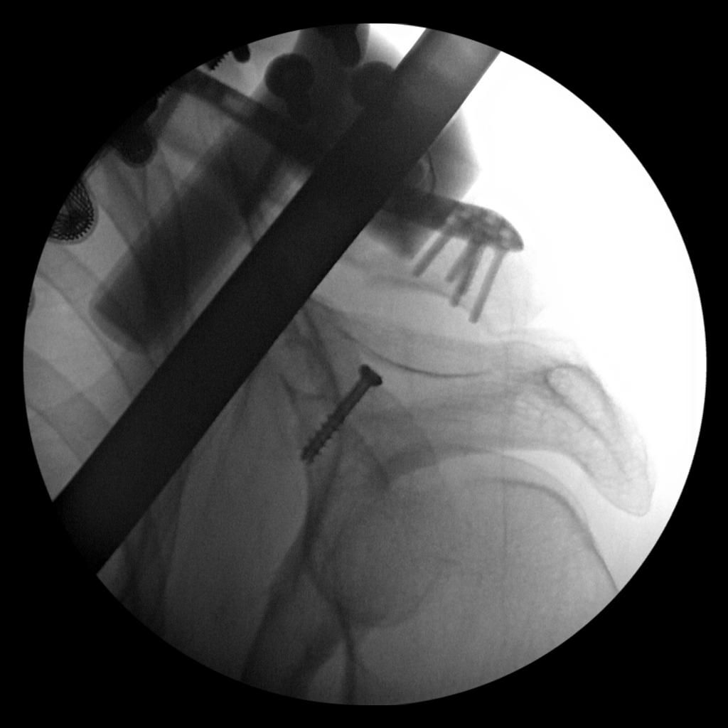
[im 7/8]
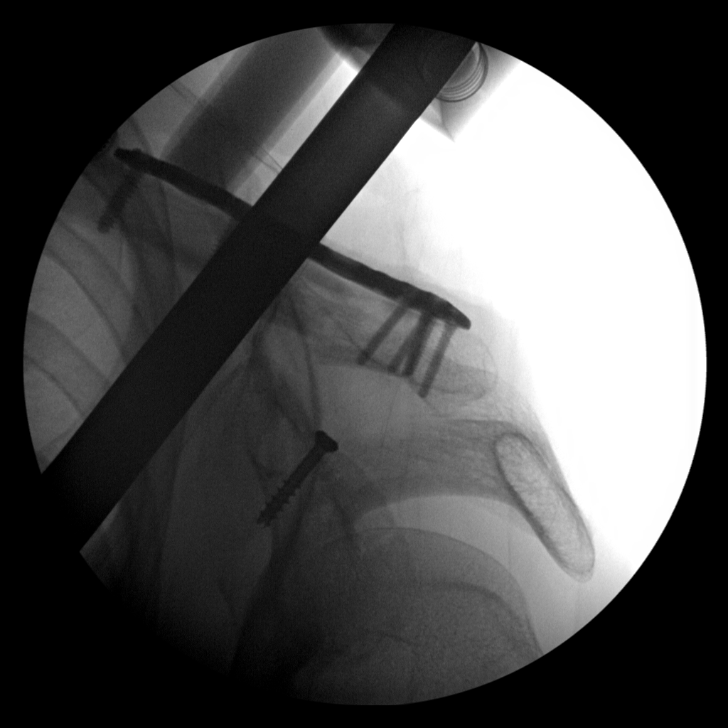
[im 8/8]
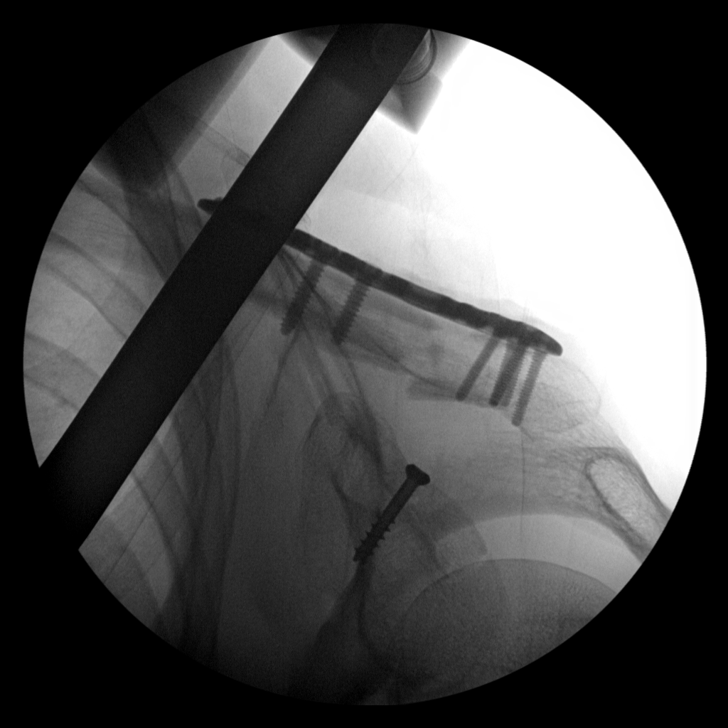

[8 of 8 positions shown; findings below may reference images not displayed]

FINDINGS: Intraoperative fluoroscopy was utilized for surgical control
purposes. Fluoroscopy time is not recorded.

Eight spot fluoroscopic images are obtained of the left shoulder
common demonstrating plate and screw fixation of a fracture of the
distal clavicle and screw fixation of the glenoid. Near-anatomic
alignment and position is suggested.
IMPRESSION: Intraoperative fluoroscopy utilized for surgical control purposes,
demonstrating internal fixation of fractures of the distal left
clavicle and left glenoid.

## 2016-11-03 IMAGING — CT CT SHOULDER*L* W/O CM
2 series · 14 of 24 positions shown · non-contrast
Comparison: 11/13/2014

CLINICAL DATA: ORIF left clavicular fracture and left glenoid
fracture

EXAM:
CT OF THE LEFT SHOULDER WITHOUT CONTRAST
TECHNIQUE: Multidetector CT imaging was performed according to the standard
protocol. Multiplanar CT image reconstructions were also generated.

[Series 208: sag · sagittal · 0.42mm/px · 11 of 79 slices shown]
[im 7/79  bone]
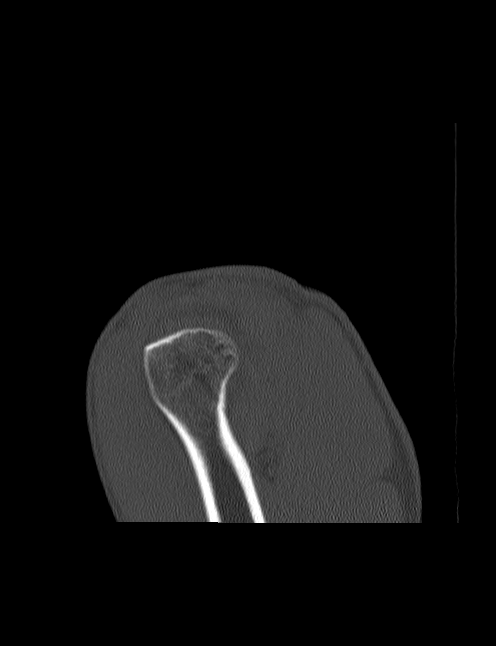
[im 14/79  bone]
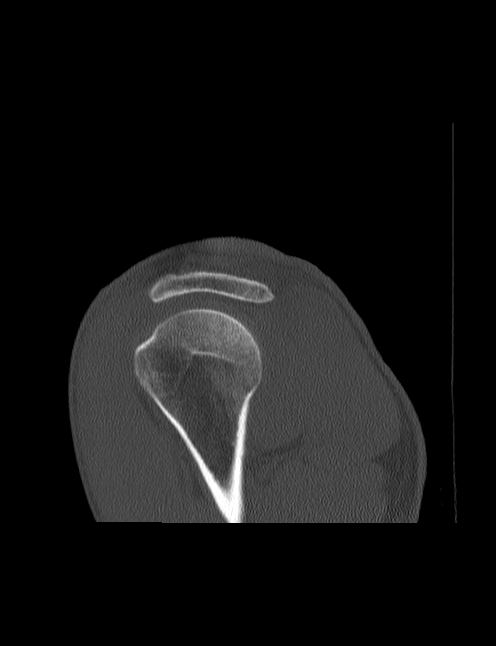
[im 20/79  bone]
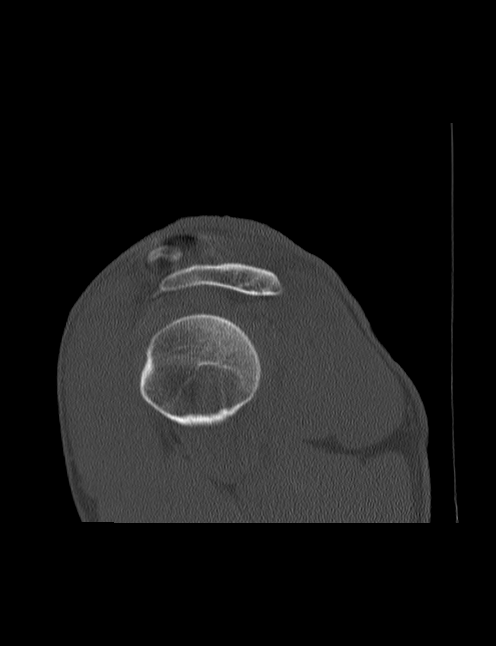
[im 27/79  bone]
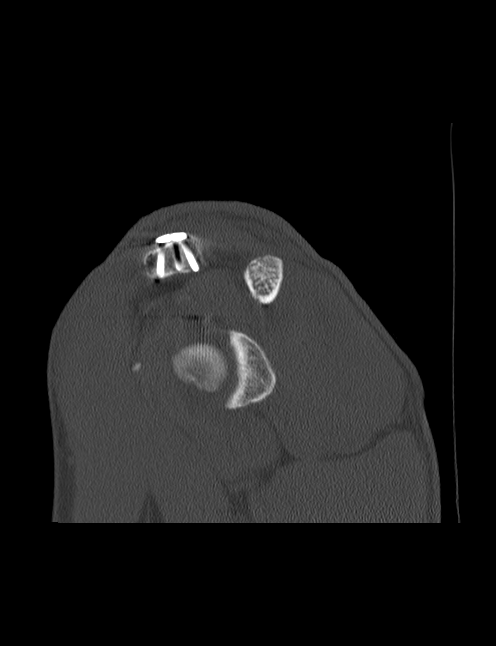
[im 33/79  bone]
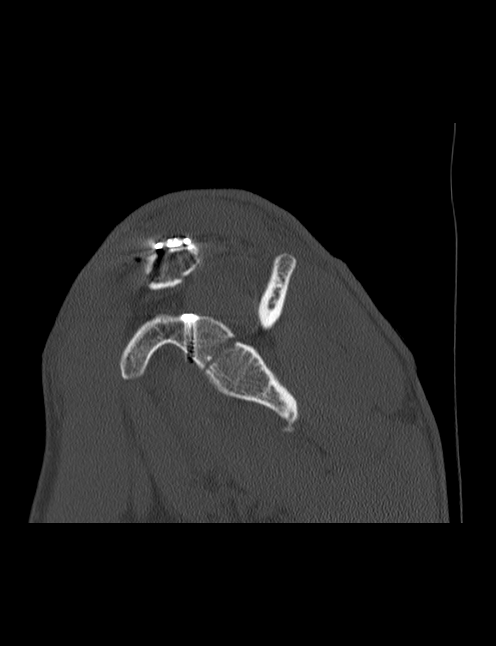
[im 40/79  bone]
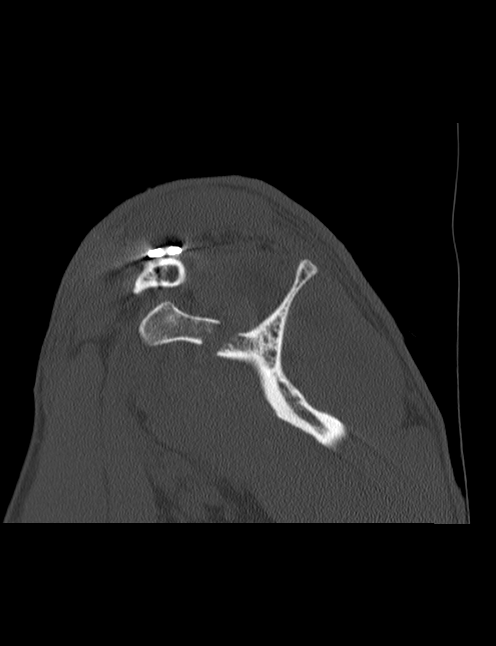
[im 46/79  bone]
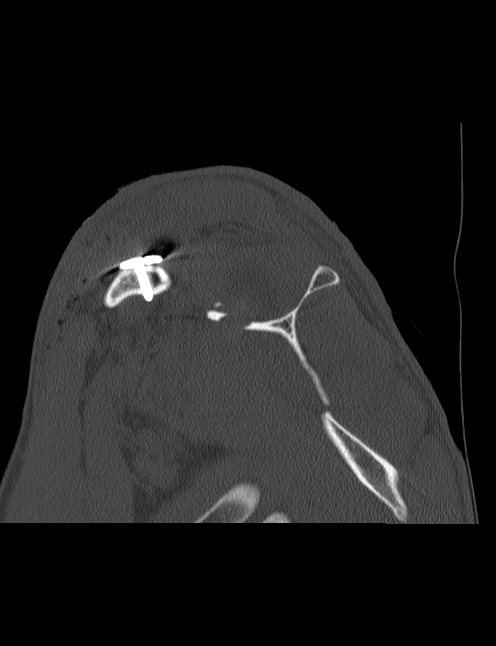
[im 53/79  bone]
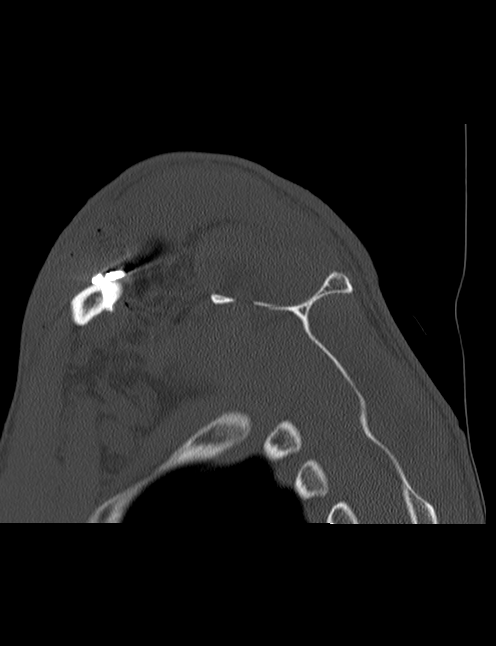
[im 59/79  bone]
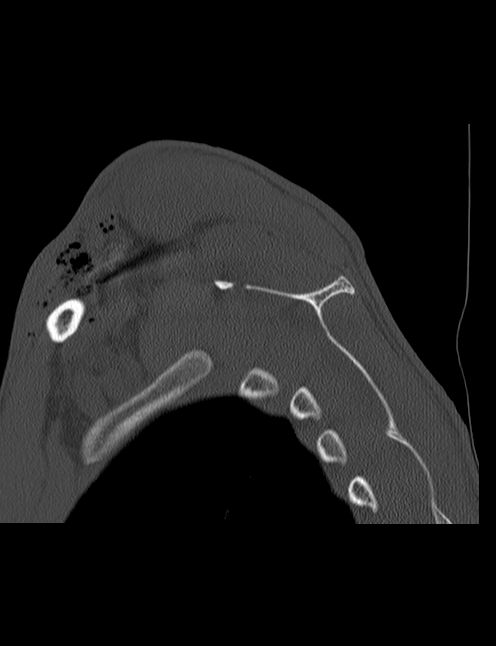
[im 66/79  bone]
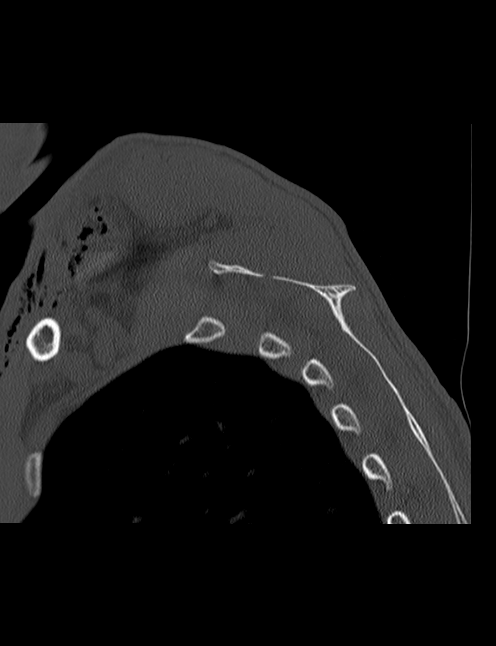
[im 72/79  bone]
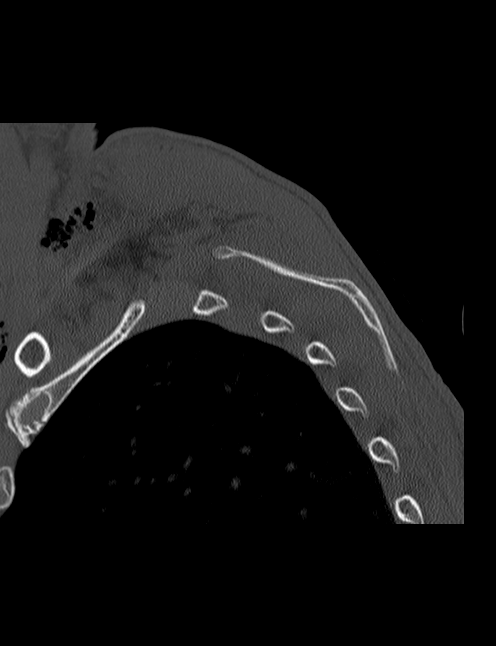

[Series 209: cor · coronal · 0.41mm/px · 3 of 87 slices shown]
[im 35/87  bone]
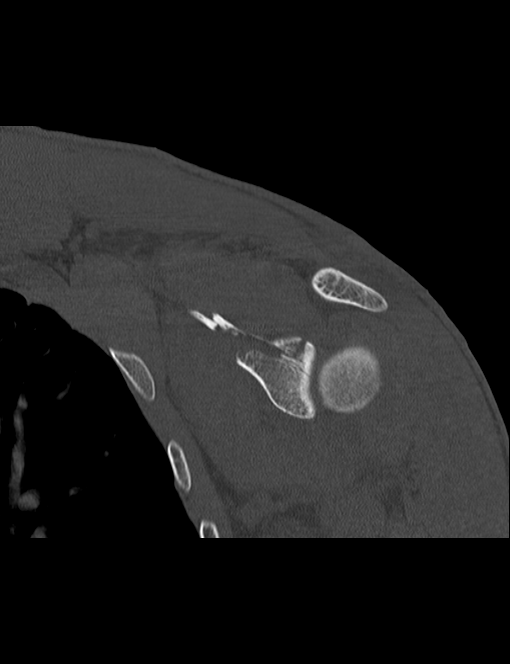
[im 44/87  bone]
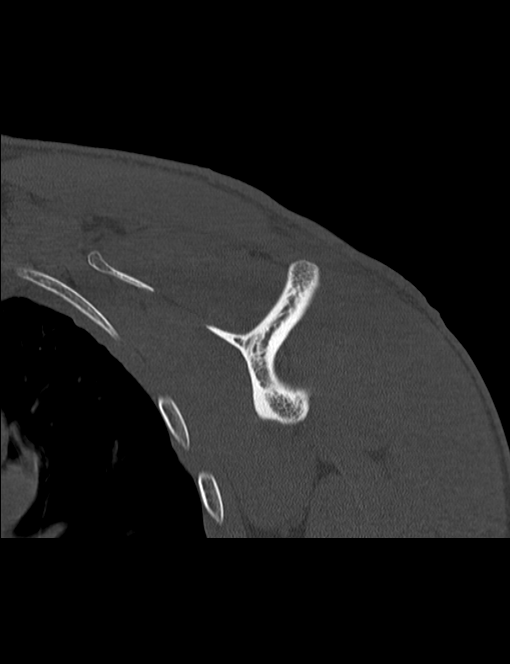
[im 52/87  bone]
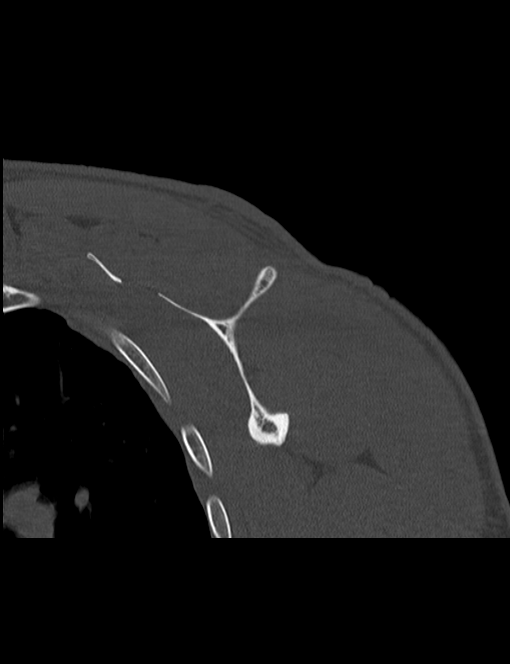

[14 of 24 positions shown; findings below may reference images not displayed]

FINDINGS: There is a distal left clavicular fracture transfixed with a
metallic side plate and multiple interlocking screws in anatomic
alignment. The acromioclavicular joint is congruent. There is soft
tissue emphysema within the soft tissues adjacent to the clavicle
consistent with recent surgery.

There is a mid glenoid fracture involving the articular surface with
3 mm of step-off at the articular surface. There is a cannulated
screw in the superior anterior glenoid which does not traverse the
primary fracture cleft through the midportion of the glenoid. There
is a horizontal fracture component which extends into the body of
the scapula. There is a comminuted fracture of the scapula involving
the body and superior wing. There is a fracture near the base of the
acromion process.

The muscles are normal. There is no muscular hematoma. There is no
fluid collection.

There is posterior left lower lobe airspace disease which may
represent atelectasis versus contusion.
IMPRESSION: 1. Distal left clavicular fracture transfixed with a metallic side
plate and multiple interlocking screws in anatomic alignment with
surrounding postsurgical changes.
2. Mid glenoid fracture involving the articular surface with 3 mm of
step-off at the articular surface. Cannulated screw is noted in the
superior anterior glenoid which does not traverse the primary
fracture cleft to the midportion of the glenoid.
